# Patient Record
Sex: Male | Born: 2005 | Race: White | Hispanic: No | Marital: Single
Health system: Southern US, Community
[De-identification: ages and names within clinical notes are randomized; demographics above are authoritative.]

## PROBLEM LIST (undated history)

## (undated) DIAGNOSIS — J45909 Unspecified asthma, uncomplicated: Secondary | ICD-10-CM

## (undated) DIAGNOSIS — F319 Bipolar disorder, unspecified: Secondary | ICD-10-CM

## (undated) DIAGNOSIS — H6690 Otitis media, unspecified, unspecified ear: Secondary | ICD-10-CM

## (undated) DIAGNOSIS — F909 Attention-deficit hyperactivity disorder, unspecified type: Secondary | ICD-10-CM

## (undated) HISTORY — PX: MYRINGOTOMY: SUR874

---
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
## 2024-03-30 ENCOUNTER — Ambulatory Visit (INDEPENDENT_AMBULATORY_CARE_PROVIDER_SITE_OTHER)

## 2024-03-30 ENCOUNTER — Ambulatory Visit (HOSPITAL_COMMUNITY)
Admission: EM | Admit: 2024-03-30 | Discharge: 2024-03-30 | Disposition: A | Discharge status: Home / Self Care | Attending: Emergency Medicine | Admitting: Emergency Medicine

## 2024-03-30 ENCOUNTER — Encounter (HOSPITAL_COMMUNITY): Payer: Self-pay

## 2024-03-30 DIAGNOSIS — S90212A Contusion of left great toe with damage to nail, initial encounter: Secondary | ICD-10-CM | POA: Diagnosis not present

## 2024-03-30 DIAGNOSIS — S90222A Contusion of left lesser toe(s) with damage to nail, initial encounter: Secondary | ICD-10-CM

## 2024-03-30 DIAGNOSIS — T148XXA Other injury of unspecified body region, initial encounter: Secondary | ICD-10-CM

## 2024-03-30 MED ORDER — CEPHALEXIN 500 MG PO CAPS: 500.0000 mg | ORAL_CAPSULE | Freq: Four times a day (QID) | ORAL | 0 refills | Status: DC

## 2024-03-30 MED ORDER — BACITRACIN ZINC 500 UNIT/GM EX OINT
TOPICAL_OINTMENT | CUTANEOUS | Status: AC
  Filled 2024-03-30: qty 0.9

## 2024-03-30 NOTE — ED Provider Notes (Signed)
 [facility]    CSN: [id number] Arrival date & time: 03/30/24  1108      History   Chief Complaint Chief Complaint  Patient presents with   Toe Injury    HPI Phillip Hurst is a 18 y.o. male.   Patient presents today with left big toe crush injury.  He was loading something metal into a truck it dropped onto his big toe yesterday.  Patient has a moderate amount of blood underneath the toenail bed.  Toenail is still attached.  Patient denies any foot pain or other digits affected.  Has not taken anything prior to arrival.  Patient has just been wrapping the injured great toe with gauze.     Past Medical History:  Diagnosis Date   ADHD (attention deficit hyperactivity disorder)    ADHD (attention deficit hyperactivity disorder)    Asthma    Bipolar disorder (HCC)    Otitis     Patient Active Problem List   Diagnosis Date Noted   ADHD (attention deficit hyperactivity disorder) 03/17/2013   Aggressive behavior 03/17/2013   Bipolar disorder, unspecified (HCC) 03/17/2013   Headache 03/17/2013    Past Surgical History:  Procedure Laterality Date   MYRINGOTOMY         Home Medications    Prior to Admission medications   Medication Sig Start Date End Date Taking? Authorizing Provider  amantadine (SYMMETREL) 100 MG capsule Take 100 mg by mouth 2 (two) times daily. 02/12/21  Yes [provider]  cephALEXin  (KEFLEX ) 500 MG capsule Take 1 capsule (500 mg total) by mouth 4 (four) times daily. 03/30/24  Yes Merilee Andrea CROME, NP  cetirizine (ZYRTEC) 10 MG tablet Take 10 mg by mouth daily. 02/20/21  Yes [provider]  chlorhexidine  (PERIDEX ) 0.12 % solution Use as directed 15 mLs in the mouth or throat 2 (two) times daily. 02/10/21  Yes Wieters, Hallie C, PA-C  Clindamycin -Benzoyl Per, Refr, gel SMARTSIG:Sparingly Topical Every Night 02/02/21  Yes [provider]  cloNIDine (CATAPRES) 0.2 MG tablet Take 0.2 mg by mouth every evening.  02/13/15  Yes [provider]  FLUoxetine (PROZAC) 20 MG capsule Take 20 mg by mouth every morning.   Yes [provider]  glycerin  adult (GLYCERIN  ADULT) 2 G SUPP Place 1 suppository rectally once. 03/07/15  Yes Remonia Alm PARAS, MD  montelukast (SINGULAIR) 5 MG chewable tablet Chew 5 mg by mouth daily. 01/29/21  Yes [provider]  oseltamivir  (TAMIFLU ) 75 MG capsule Take 1 capsule (75 mg total) by mouth every 12 (twelve) hours. 09/14/21  Yes Mound, Darryle E, FNP  QUEtiapine (SEROQUEL) 400 MG tablet Take 600 mg by mouth at bedtime.   Yes [provider]  triamcinolone  cream (KENALOG ) 0.1 % Apply 1 application topically 2 (two) times daily. 03/08/21  Yes Stuart Vernell Norris, PA-C  albuterol  (PROVENTIL  HFA;VENTOLIN  HFA) 108 (90 BASE) MCG/ACT inhaler Inhale 2 puffs into the lungs as needed for wheezing or shortness of breath.     [provider]  divalproex  (DEPAKOTE  ER) 500 MG 24 hr tablet Take 500 mg by mouth 2 (two) times daily. 02/13/15   [provider]  ibuprofen  (ADVIL ) 400 MG tablet Take 1 tablet (400 mg total) by mouth every 6 (six) hours as needed. 02/10/21   Wieters, Hallie C, PA-C  methylphenidate  (RITALIN ) 10 MG tablet Take 1 tablet (10 mg total) by mouth 2 (two) times daily. 06/14/14   Waddell Elna BIRCH, MD  methylphenidate  54 MG PO CR  tablet Take 54 mg by mouth every morning.    [provider]  predniSONE  (DELTASONE ) 20 MG tablet Take 1 tablet (20 mg total) by mouth daily with breakfast. 03/08/21   Stuart Vernell Norris, PA-C  traZODone  (DESYREL ) 50 MG tablet Take 0.5 tablets (25 mg total) by mouth at bedtime. 05/27/14   Jacquetta Sharlot GRADE, NP  VYVANSE  60 MG capsule Take 60 mg by mouth every morning. 02/13/21   [provider]    Family History Family History  Problem Relation Age of Onset   Bipolar disorder Mother    Schizophrenia Mother    Diabetes Other    Hypertension Other     Social History Social History    Tobacco Use   Smoking status: Passive Smoke Exposure - Never Smoker   Smokeless tobacco: Never  Substance Use Topics   Alcohol use: No   Drug use: No     Allergies   Patient has no known allergies.   Review of Systems Review of Systems  Constitutional: Negative.   Respiratory: Negative.    Cardiovascular: Negative.   Gastrointestinal: Negative.   Musculoskeletal:        Left toe nailbed moderate amount of blood under nail crush injury decreased movement slight swelling  Skin:  Positive for wound. Negative for rash.     Physical Exam Triage Vital Signs ED Triage Vitals [03/30/24 1150]  Encounter Vitals Group     BP 126/85     Girls Systolic BP Percentile      Girls Diastolic BP Percentile      Boys Systolic BP Percentile      Boys Diastolic BP Percentile      Pulse Rate 74     Resp 18     Temp 98.6 F (37 C)     Temp Source Oral     SpO2 97 %     Weight      Height      Head Circumference      Peak Flow      Pain Score      Pain Loc      Pain Education      Exclude from Growth Chart    No data found.  Updated Vital Signs BP 126/85 (BP Location: Left Arm)   Pulse 74   Temp 98.6 F (37 C) (Oral)   Resp 18   SpO2 97%   Visual Acuity Right Eye Distance:   Left Eye Distance:   Bilateral Distance:    Right Eye Near:   Left Eye Near:    Bilateral Near:     Physical Exam  Cardiovascular:     Rate and Rhythm: Normal rate.     Pulses: Normal pulses.  Pulmonary:     Effort: Pulmonary effort is normal.  Abdominal:     General: Abdomen is flat.   Musculoskeletal:        General: Swelling, tenderness and signs of injury present.     Comments: Left great toe +1 edema noted small amount of bleeding and bruising noted around nailbed and underneath the nailbed.  Nailbed still attached no lifting noted.  Toe is still pink in color warm to touch   Skin:    Capillary Refill: Capillary refill takes 2 to 3 seconds.   Neurological:     General: No  focal deficit present.     Mental Status: He is alert.      UC Treatments / Results  Labs (all labs ordered are listed,  but only abnormal results are displayed) Labs Reviewed - No data to display  EKG   Radiology DG Toe Great Left Result Date: 03/30/2024 CLINICAL DATA:  Trauma to the left great toe. EXAM: LEFT GREAT TOE COMPARISON:  None Available. FINDINGS: There is no evidence of fracture or dislocation. There is no evidence of arthropathy or other focal bone abnormality. Soft tissues are unremarkable. IMPRESSION: Negative. Electronically Signed   By: Vanetta Chou M.D.   On: 03/30/2024 13:41    Procedures Procedures (including critical care time)  Medications Ordered in UC Medications - No data to display  Initial Impression / Assessment and Plan / UC Course  I have reviewed the triage vital signs and the nursing notes.  Pertinent labs & imaging results that were available during my care of the patient were reviewed by me and considered in my medical decision making (see chart for details).     Wash and dry the area twice a day keep covered Follow-up with orthopedic if symptoms do not get better in 5 to 7 days Take Tylenol  and Motrin  as needed for pain Wash dry area well keep it covered for at least the 5 to 7 days Take full dose of antibiotics to cover for any possible infection Keep an eye on the toe nail bed this may continue to drain If symptoms persist follow-up with [facility]  Final Clinical Impressions(s) / UC Diagnoses   Final diagnoses:  Crush injury  Contusion of toenail of left foot, initial encounter     Discharge Instructions      Wash dry area well keep it covered for at least the 5 to 7 days Take Tylenol  Motrin  as needed for pain Take full dose of antibiotics to cover for any possible infection Keep an eye on the toe nail bed this may continue to drain If symptoms persist follow-up with [facility]     ED Prescriptions     Medication  Sig Dispense Auth. Provider   cephALEXin  (KEFLEX ) 500 MG capsule Take 1 capsule (500 mg total) by mouth 4 (four) times daily. 20 capsule Merilee Andrea CROME, NP      PDMP not reviewed this encounter.   Merilee Andrea CROME, NP 03/30/24 1347

## 2024-03-30 NOTE — Discharge Instructions (Signed)
 Wash dry area well keep it covered for at least the 5 to 7 days Take Tylenol  Motrin  as needed for pain Take full dose of antibiotics to cover for any possible infection Keep an eye on the toe nail bed this may continue to drain If symptoms persist follow-up with [facility]

## 2024-03-30 NOTE — ED Triage Notes (Signed)
 Here for L-great toe injury. Patient reports a metal plate fall on his toe.

## 2024-07-16 ENCOUNTER — Encounter: Payer: Self-pay | Admitting: Emergency Medicine

## 2024-07-16 ENCOUNTER — Ambulatory Visit: Admission: EM | Admit: 2024-07-16 | Discharge: 2024-07-16 | Disposition: A | Discharge status: Home / Self Care

## 2024-07-16 DIAGNOSIS — L03032 Cellulitis of left toe: Secondary | ICD-10-CM | POA: Diagnosis not present

## 2024-07-16 DIAGNOSIS — S97112D Crushing injury of left great toe, subsequent encounter: Secondary | ICD-10-CM | POA: Diagnosis not present

## 2024-07-16 DIAGNOSIS — L603 Nail dystrophy: Secondary | ICD-10-CM | POA: Diagnosis not present

## 2024-07-16 MED ORDER — CEPHALEXIN 500 MG PO CAPS: 500.0000 mg | ORAL_CAPSULE | Freq: Three times a day (TID) | ORAL | 0 refills | Status: AC

## 2024-07-16 MED ORDER — FLUCONAZOLE 150 MG PO TABS: 150.0000 mg | ORAL_TABLET | ORAL | 0 refills | Status: AC

## 2024-07-16 NOTE — ED Triage Notes (Addendum)
 Pt here for follow up on L great toe nail injury. Pt reports new sharp pain to L great toe x2 days. Pain is only present with touch or pressure applied to area. States it feels like needles. No radiation of pain. No swelling to area. Pt reports taking ibuprofen  a few times over the last 2 days with some relief. Pt states he feels like the nail is growing out weird and wants it checked.   Original injury was in July 2025 with the following note from [facility] visit: Patient presents today with left big toe crush injury. He was loading something metal into a truck it dropped onto his big toe yesterday. Patient has a moderate amount of blood underneath the toenail bed. Toenail is still attached. Patient denies any foot pain or other digits affected. Has not taken anything prior to arrival. Patient has just been wrapping the injured great toe with gauze.   Pt did not finish keflex  dose prescribed at the original visit. States he has 4 pills left.

## 2024-07-16 NOTE — ED Provider Notes (Signed)
 [facility]    CSN: [id number] Arrival date & time: 07/16/24  1249      History   Chief Complaint Chief Complaint  Patient presents with   Toe Pain    HPI Phillip Hurst is a 18 y.o. male.   Discussed the use of AI scribe software for clinical note transcription with the patient, who gave verbal consent to proceed.   Patient presents for evaluation of left great toe pain related to a prior injury sustained on March 30, 2024, when he dropped an object on his foot. At the time of injury, he was seen in clinic and noted to have subungual bleeding with the toenail still attached, mild edema, and bruising around the nailbed. The toe appeared pink, warm, and well-perfused with normal capillary refill. X-ray at that visit showed no fracture, dislocation, or soft tissue abnormalities. He was prescribed cephalexin  prophylactically to prevent infection and advised on wound care and orthopedic follow-up if symptoms did not improve.  Since then, the patient reports that the toenail eventually detached completely, and he removed the remaining portion. Over the past couple of months, the nail has been regrowing, but he feels it is healing weird and has recently become painful again. 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 He denies fever, chills, redness, drainage, or swelling beyond the nail area. He has not used any medication or treatments for the current symptoms. No pain in other toes or areas of the foot.  The following sections of the patient's history were reviewed and updated as appropriate: allergies, current medications, past family history, past medical history, past social history, past surgical history, and problem list.      Past Medical History:  Diagnosis Date   ADHD (attention deficit hyperactivity disorder)    ADHD (attention deficit hyperactivity disorder)    Asthma    Bipolar disorder (HCC)     Otitis     Patient Active Problem List   Diagnosis Date Noted   ADHD (attention deficit hyperactivity disorder) 03/17/2013   Aggressive behavior 03/17/2013   Bipolar disorder, unspecified (HCC) 03/17/2013   Headache 03/17/2013    Past Surgical History:  Procedure Laterality Date   MYRINGOTOMY         Home Medications    Prior to Admission medications   Medication Sig Start Date End Date Taking? Authorizing Provider  cephALEXin  (KEFLEX ) 500 MG capsule Take 1 capsule (500 mg total) by mouth 3 (three) times daily for 7 days. 07/16/24 07/23/24 Yes Iola Lukes, FNP  fluconazole (DIFLUCAN) 150 MG tablet Take 1 tablet (150 mg total) by mouth every 3 (three) days for 2 doses. 07/16/24 07/20/24 Yes Olympia Adelsberger, FNP  ibuprofen  (ADVIL ) 400 MG tablet Take 1 tablet (400 mg total) by mouth every 6 (six) hours as needed. 02/10/21  Yes Wieters, Hallie C, PA-C  albuterol  (PROVENTIL  HFA;VENTOLIN  HFA) 108 (90 BASE) MCG/ACT inhaler Inhale 2 puffs into the lungs as needed for wheezing or shortness of breath.  Patient not taking: Reported on 07/16/2024    [provider]  amantadine (SYMMETREL) 100 MG capsule Take 100 mg by mouth 2 (two) times daily. Patient not taking: Reported on 07/16/2024 02/12/21   [provider]  cetirizine (ZYRTEC) 10 MG tablet Take 10 mg by mouth daily. Patient not taking: Reported on 07/16/2024 02/20/21   [provider]  chlorhexidine  (PERIDEX ) 0.12 % solution Use as directed 15 mLs in the mouth or throat 2 (two)  times daily. Patient not taking: Reported on 07/16/2024 02/10/21   Wieters, Hallie C, PA-C  Clindamycin -Benzoyl Per, Refr, gel SMARTSIG:Sparingly Topical Every Night Patient not taking: Reported on 07/16/2024 02/02/21   [provider]  cloNIDine (CATAPRES) 0.2 MG tablet Take 0.2 mg by mouth every evening. Patient not taking: Reported on 07/16/2024 02/13/15   [provider]  divalproex  (DEPAKOTE  ER) 500 MG 24  hr tablet Take 500 mg by mouth 2 (two) times daily. Patient not taking: Reported on 07/16/2024 02/13/15   [provider]  FLUoxetine (PROZAC) 20 MG capsule Take 20 mg by mouth every morning. Patient not taking: Reported on 07/16/2024    [provider]  glycerin  adult (GLYCERIN  ADULT) 2 G SUPP Place 1 suppository rectally once. Patient not taking: Reported on 07/16/2024 03/07/15   Remonia Alm PARAS, MD  guanFACINE  (INTUNIV ) 2 MG TB24 ER tablet Take 2 mg by mouth. Patient not taking: Reported on 07/16/2024 01/29/13   [provider]  methylphenidate  (RITALIN ) 10 MG tablet Take 1 tablet (10 mg total) by mouth 2 (two) times daily. Patient not taking: Reported on 07/16/2024 06/14/14   Waddell Elna BIRCH, MD  methylphenidate  54 MG PO CR tablet Take 54 mg by mouth every morning. Patient not taking: Reported on 07/16/2024    [provider]  montelukast (SINGULAIR) 5 MG chewable tablet Chew 5 mg by mouth daily. Patient not taking: Reported on 07/16/2024 01/29/21   [provider]  oseltamivir  (TAMIFLU ) 75 MG capsule Take 1 capsule (75 mg total) by mouth every 12 (twelve) hours. Patient not taking: Reported on 07/16/2024 09/14/21   Hazen Darryle BRAVO, FNP  predniSONE  (DELTASONE ) 20 MG tablet Take 1 tablet (20 mg total) by mouth daily with breakfast. Patient not taking: Reported on 07/16/2024 03/08/21   Stuart Vernell Norris, PA-C  QUEtiapine (SEROQUEL) 400 MG tablet Take 600 mg by mouth at bedtime. Patient not taking: Reported on 07/16/2024    [provider]  traZODone  (DESYREL ) 50 MG tablet Take 0.5 tablets (25 mg total) by mouth at bedtime. Patient not taking: Reported on 07/16/2024 05/27/14   Jacquetta Sharlot GRADE, NP  triamcinolone  cream (KENALOG ) 0.1 % Apply 1 application topically 2 (two) times daily. Patient not taking: Reported on 07/16/2024 03/08/21   Stuart Vernell Norris, PA-C  VYVANSE  60 MG capsule Take 60 mg by mouth every morning. Patient not taking:  Reported on 07/16/2024 02/13/21   [provider]    Family History Family History  Problem Relation Age of Onset   Bipolar disorder Mother    Schizophrenia Mother    Diabetes Other    Hypertension Other     Social History Social History   Tobacco Use   Smoking status: Passive Smoke Exposure - Never Smoker   Smokeless tobacco: Never  Vaping Use   Vaping status: Never Used  Substance Use Topics   Alcohol use: No   Drug use: No     Allergies   Patient has no known allergies.   Review of Systems Review of Systems  Constitutional:  Negative for chills and fever.  Gastrointestinal:  Negative for nausea and vomiting.  Musculoskeletal:  Positive for arthralgias. Negative for gait problem and joint swelling.  All other systems reviewed and are negative.    Physical Exam Triage Vital Signs ED Triage Vitals [07/16/24 1407]  Encounter Vitals Group     BP 122/81     Girls Systolic BP Percentile      Girls Diastolic BP Percentile  Boys Systolic BP Percentile      Boys Diastolic BP Percentile      Pulse Rate 75     Resp 14     Temp 98 F (36.7 C)     Temp Source Oral     SpO2 98 %     Weight      Height      Head Circumference      Peak Flow      Pain Score 1     Pain Loc      Pain Education      Exclude from Growth Chart    No data found.  Updated Vital Signs BP 122/81 (BP Location: Left Arm)   Pulse 75   Temp 98 F (36.7 C) (Oral)   Resp 14   SpO2 98%   Visual Acuity Right Eye Distance:   Left Eye Distance:   Bilateral Distance:    Right Eye Near:   Left Eye Near:    Bilateral Near:     Physical Exam Vitals reviewed.  Constitutional:      General: He is awake. He is not in acute distress.    Appearance: Normal appearance. He is well-developed. He is not ill-appearing, toxic-appearing or diaphoretic.  HENT:     Head: Normocephalic.     Right Ear: Hearing normal.     Left Ear: Hearing normal.     Nose: Nose normal.      Mouth/Throat:     Mouth: Mucous membranes are moist.  Eyes:     General: Vision grossly intact.     Conjunctiva/sclera: Conjunctivae normal.  Cardiovascular:     Rate and Rhythm: Normal rate and regular rhythm.     Heart sounds: Normal heart sounds.  Pulmonary:     Effort: Pulmonary effort is normal.     Breath sounds: Normal breath sounds and air entry.  Musculoskeletal:        General: Normal range of motion.     Cervical back: Normal range of motion and neck supple.  Feet:     Comments: Left great toe shows a thickened, dystrophic nail with yellow-brown discoloration and horizontal ridging across the nail plate. The nail is brittle and partially lifted from the distal nailbed. Mild dryness and scaling are noted along the nail folds without significant erythema, swelling, or drainage. The surrounding skin of the distal toe is intact, with no signs of paronychia; however, there is mild warmth and faint redness suggestive of early cellulitis. No open lesions, deformities, or areas of fluctuance are present. Capillary refill is normal, and sensation remains intact.  Skin:    General: Skin is warm and dry.  Neurological:     General: No focal deficit present.     Mental Status: He is alert and oriented to person, place, and time.  Psychiatric:        Speech: Speech normal.        Behavior: Behavior is cooperative.      UC Treatments / Results  Labs (all labs ordered are listed, but only abnormal results are displayed) Labs Reviewed - No data to display  EKG   Radiology No results found.  Procedures Procedures (including critical care time)  Medications Ordered in UC Medications - No data to display  Initial Impression / Assessment and Plan / UC Course  I have reviewed the triage vital signs and the nursing notes.  Pertinent labs & imaging results that were available during my care of the patient were  reviewed by me and considered in my medical decision making (see chart  for details).     The patient presents with left great toe pain and abnormal nail growth following a prior crush injury sustained on March 30, 2024. At the time of injury, there was subungual hematoma without nail detachment, and imaging showed no fracture or soft tissue abnormality. The patient was treated with cephalexin  and advised on wound care. Since then, the nail has detached and begun regrowing but has developed an abnormal appearance and increasing discomfort. Examination today shows a thickened, dystrophic nail with yellow-brown discoloration, horizontal ridging, and partial distal lifting, consistent with post-traumatic onychodystrophy versus onychomycosis. There is mild surrounding warmth and erythema suggestive of early cellulitis but no drainage, swelling, or fluctuance. Neurovascular status remains intact.  Given the findings, the presentation is most consistent with abnormal regrowth of the nail following trauma, complicated by possible secondary infection. Cephalexin  was prescribed to treat early cellulitis and prevent progression of infection. The patient was advised to keep the area clean and dry, wash gently with mild soap and water, and avoid applying lotions, ointments, or creams to the nail or surrounding skin. Podiatry follow-up was recommended for further evaluation and management of the abnormal nail growth, with contact information provided for [facility] in [place]. The importance of completing all antibiotics even if symptoms improve was emphasized, and the patient was instructed to seek prompt care if redness, swelling, pain, or drainage worsens.  Today's evaluation has revealed no signs of a dangerous process. Discussed diagnosis with patient and/or guardian. Patient and/or guardian aware of their diagnosis, possible red flag symptoms to watch out for and need for close follow up. Patient and/or guardian understands verbal and written discharge  instructions. Patient and/or guardian comfortable with plan and disposition.  Patient and/or guardian has a clear mental status at this time, good insight into illness (after discussion and teaching) and has clear judgment to make decisions regarding their care  Documentation was completed with the aid of voice recognition software. Transcription may contain typographical errors.   Final Clinical Impressions(s) / UC Diagnoses   Final diagnoses:  Cellulitis of great toe of left foot  Crushing injury of left great toe, subsequent encounter  Onychodystrophy     Discharge Instructions      You were seen today for pain and changes in your left big toenail following a previous injury that occurred in July when something was dropped on your foot. The nail has since fallen off and is now growing back, but it appears thickened, discolored, and partially lifted, which can happen after trauma. There are also mild signs of irritation that may represent an early infection. Your symptoms and exam are consistent with an abnormal regrowth of the nail, called onychodystrophy, and possibly a fungal nail infection or early cellulitis. You were prescribed an antibiotic called cephalexin  to treat and prevent any infection from developing further. Take this medication exactly as prescribed and complete the full course, even if your toe starts to feel better. Keep your toe clean and dry by washing it gently with mild soap and water, then patting it dry. Do not apply ointments, creams, or lotions around the nail, as this can trap moisture and slow healing. Wear clean, breathable socks and shoes with enough room to avoid pressure on the toe. If your job requires standing for long periods, try to rest and elevate your foot when possible to reduce discomfort. You should schedule a follow-up appointment  with a podiatrist for further evaluation and treatment of your toenail. You were provided contact information for [facility] in [place], and you should call to make this appointment as soon as possible. Seek medical attention right away if you notice worsening redness, swelling, drainage, foul odor, increased pain, fever, or red streaking up the foot or leg, as these could be signs of a more serious infection that requires urgent care.     ED Prescriptions     Medication Sig Dispense Auth. Provider   cephALEXin  (KEFLEX ) 500 MG capsule Take 1 capsule (500 mg total) by mouth 3 (three) times daily for 7 days. 21 capsule Iola Lukes, FNP   fluconazole (DIFLUCAN) 150 MG tablet Take 1 tablet (150 mg total) by mouth every 3 (three) days for 2 doses. 2 tablet Iola Lukes, FNP      PDMP not reviewed this encounter.   Iola Lukes, [place] 07/16/24 1459

## 2024-07-16 NOTE — Discharge Instructions (Addendum)
 You were seen today for pain and changes in your left big toenail following a previous injury that occurred in July when something was dropped on your foot. The nail has since fallen off and is now growing back, but it appears thickened, discolored, and partially lifted, which can happen after trauma. There are also mild signs of irritation that may represent an early infection. Your symptoms and exam are consistent with an abnormal regrowth of the nail, called onychodystrophy, and possibly a fungal nail infection or early cellulitis. You were prescribed an antibiotic called cephalexin  to treat and prevent any infection from developing further. Take this medication exactly as prescribed and complete the full course, even if your toe starts to feel better. Keep your toe clean and dry by washing it gently with mild soap and water, then patting it dry. Do not apply ointments, creams, or lotions around the nail, as this can trap moisture and slow healing. Wear clean, breathable socks and shoes with enough room to avoid pressure on the toe. If your job requires standing for long periods, try to rest and elevate your foot when possible to reduce discomfort. You should schedule a follow-up appointment with a podiatrist for further evaluation and treatment of your toenail. You were provided contact information for [facility] in [place], and you should call to make this appointment as soon as possible. Seek medical attention right away if you notice worsening redness, swelling, drainage, foul odor, increased pain, fever, or red streaking up the foot or leg, as these could be signs of a more serious infection that requires urgent care.

## 2024-07-23 ENCOUNTER — Encounter: Payer: Self-pay | Admitting: Podiatry

## 2024-07-23 ENCOUNTER — Ambulatory Visit: Admitting: Podiatry

## 2024-07-23 DIAGNOSIS — L600 Ingrowing nail: Secondary | ICD-10-CM | POA: Diagnosis not present

## 2024-07-23 NOTE — Patient Instructions (Signed)

## 2024-07-24 NOTE — Progress Notes (Signed)
 Subjective:   Patient ID: Phillip Hurst, male   DOB: 18 y.o.   MRN: [id number]   HPI Patient presents with mother with severely damaged left hallux nail which is digging into his skin and making shoe gear difficult.  He states he traumatized this a while ago and has been growing this way.  Patient does not smoke likes to be active   Review of Systems  All other systems reviewed and are negative.       Objective:  Physical Exam Vitals and nursing note reviewed.  Constitutional:      Appearance: He is well-developed.  Pulmonary:     Effort: Pulmonary effort is normal.  Musculoskeletal:        General: Normal range of motion.  Skin:    General: Skin is warm.  Neurological:     Mental Status: He is alert.     Neurovascular status intact muscle strength found to be adequate range of motion adequate severely incurvated left hallux nail painful when pressed inability to wear shoe gear without difficulty.  Good digital perfusion well oriented     Assessment:  Severe traumatized left hallux nail with distal pain across the entire nailbed and not growing over the digit itself     Plan:  H&P reviewed in great length condition.  I do think given the intense discomfort it would be best corrected patient wants this done and does mother also allow them to read consent form going over procedure permanent nature of it and risk and patient's signed consent form.  I infiltrated the left big toe 60 mg like Marcaine mixture sterile prep done using sterile instrumentation removed the hallux nail exposed matrix applied phenol for applications 30 seconds followed by alcohol lavage sterile dressing gave instructions on soaks wear dressing 24 hours taken off earlier if throbbing were to occur and encouraged to call with questions concerns
